# Patient Record
Sex: Female | Born: 2005 | Race: White | Hispanic: No | Marital: Single | Smoking: Current some day smoker
Health system: Southern US, Community
[De-identification: ages and names within clinical notes are randomized; demographics above are authoritative.]

## PROBLEM LIST (undated history)

## (undated) DIAGNOSIS — F32A Depression, unspecified: Secondary | ICD-10-CM

## (undated) DIAGNOSIS — G43109 Migraine with aura, not intractable, without status migrainosus: Secondary | ICD-10-CM

## (undated) DIAGNOSIS — J45909 Unspecified asthma, uncomplicated: Secondary | ICD-10-CM

## (undated) DIAGNOSIS — F419 Anxiety disorder, unspecified: Secondary | ICD-10-CM

## (undated) HISTORY — DX: Anxiety disorder, unspecified: F41.9

## (undated) HISTORY — DX: Unspecified asthma, uncomplicated: J45.909

## (undated) HISTORY — DX: Migraine with aura, not intractable, without status migrainosus: G43.109

## (undated) HISTORY — PX: APPENDECTOMY: SHX54

## (undated) HISTORY — DX: Depression, unspecified: F32.A

---
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
## 2023-03-04 ENCOUNTER — Ambulatory Visit: Payer: Medicaid Other | Admitting: Family Medicine

## 2023-03-04 ENCOUNTER — Encounter: Payer: Self-pay | Admitting: Family Medicine

## 2023-03-04 ENCOUNTER — Other Ambulatory Visit (HOSPITAL_COMMUNITY)
Admission: RE | Admit: 2023-03-04 | Discharge: 2023-03-04 | Disposition: A | Discharge status: Home / Self Care | Payer: Medicaid Other | Source: Ambulatory Visit | Attending: Family Medicine | Admitting: Family Medicine

## 2023-03-04 VITALS — BP 112/70 | HR 93 | Resp 16 | Ht 67.0 in | Wt 231.0 lb

## 2023-03-04 DIAGNOSIS — Z3041 Encounter for surveillance of contraceptive pills: Secondary | ICD-10-CM | POA: Diagnosis not present

## 2023-03-04 DIAGNOSIS — F332 Major depressive disorder, recurrent severe without psychotic features: Secondary | ICD-10-CM | POA: Diagnosis not present

## 2023-03-04 DIAGNOSIS — G43109 Migraine with aura, not intractable, without status migrainosus: Secondary | ICD-10-CM

## 2023-03-04 DIAGNOSIS — F419 Anxiety disorder, unspecified: Secondary | ICD-10-CM | POA: Diagnosis not present

## 2023-03-04 DIAGNOSIS — Z7251 High risk heterosexual behavior: Secondary | ICD-10-CM

## 2023-03-04 DIAGNOSIS — N96 Recurrent pregnancy loss: Secondary | ICD-10-CM | POA: Diagnosis not present

## 2023-03-04 DIAGNOSIS — Z7689 Persons encountering health services in other specified circumstances: Secondary | ICD-10-CM

## 2023-03-04 DIAGNOSIS — L732 Hidradenitis suppurativa: Secondary | ICD-10-CM

## 2023-03-04 DIAGNOSIS — Z13220 Encounter for screening for lipoid disorders: Secondary | ICD-10-CM

## 2023-03-04 LAB — POCT URINE PREGNANCY: Preg Test, Ur: NEGATIVE

## 2023-03-04 NOTE — Progress Notes (Unsigned)
New patient visit   Patient: Connie Carter   DOB: 11-Feb-2005   18 y.o. Female  MRN: [id number] Visit Date: 03/04/2023  Today's healthcare provider: Sherlyn Hay, DO   Chief Complaint  Patient presents with   Establish Care   Migraine    Chronic issue. Takes Maxalt-need a refill   Contraception    Need a refill   Subjective    Connie Carter is a 18 y.o. female who presents today as a new patient to establish care.  Migraine    HPI     Migraine    Additional comments: Chronic issue. Takes Maxalt-need a refill        Contraception    Additional comments: Need a refill      Last edited by Marjie Skiff, CMA on 03/04/2023  1:51 PM.        ***  Past Medical History:  Diagnosis Date   Asthma    Past Surgical History:  Procedure Laterality Date   APPENDECTOMY     No family status information on file.   No family history on file. Social History   Socioeconomic History   Marital status: Single    Spouse name: Not on file   Number of children: Not on file   Years of education: Not on file   Highest education level: Not on file  Occupational History   Not on file  Tobacco Use   Smoking status: Some Days    Types: Cigarettes, E-cigarettes   Smokeless tobacco: Never  Vaping Use   Vaping status: Every Day  Substance and Sexual Activity   Alcohol use: Not Currently    Alcohol/week: 2.0 - 4.0 standard drinks of alcohol    Types: 2 - 3 Standard drinks or equivalent per week   Drug use: Yes    Types: Marijuana   Sexual activity: Not on file  Other Topics Concern   Not on file  Social History Narrative   Not on file   Social Drivers of Health   Financial Resource Strain: Not on file  Food Insecurity: Not on file  Transportation Needs: Not on file  Physical Activity: Not on file  Stress: Not on file  Social Connections: Not on file   Outpatient Medications Prior to Visit  Medication Sig   acetaminophen (TYLENOL) 325 MG tablet Take 650  mg by mouth every 6 (six) hours as needed for headache.   ibuprofen (ADVIL,MOTRIN) 600 MG tablet Take 600 mg by mouth every 6 (six) hours. (Patient not taking: Reported on 03/09/2021)   norethindrone-ethinyl estradiol (NORTREL 7/7/7) 0.5/0.75/1-35 MG-MCG tablet Take by mouth.   ondansetron (ZOFRAN-ODT) 4 MG disintegrating tablet Take by mouth.   rizatriptan (MAXALT-MLT) 10 MG disintegrating tablet Take by mouth.   sulfamethoxazole-trimethoprim (BACTRIM DS) 800-160 MG tablet Take 1 tablet by mouth 2 (two) times daily.   No facility-administered medications prior to visit.   Allergies  Allergen Reactions   Kiwi Extract Swelling    Oral swelling, itching    Immunization History  Administered Date(s) Administered   DTaP 05/11/2003, 06/14/2003, 07/12/2003, 09/14/2003, 06/12/2004, 04/09/2008   HIB (PRP-OMP) 05/11/2003, 07/02/2003, 09/14/2003, 06/12/2004   HIB, Unspecified 03/11/2006   HPV Quadrivalent 07/02/2014, 08/17/2015   Hepatitis A, Ped/Adol-2 Dose 03/12/2005, 03/11/2006   Hepatitis B, PED/ADOLESCENT December 30, 2003, 04/02/2003, 12/13/2003   Influenza-Unspecified 11/17/2007, 11/18/2010, 01/01/2012, 12/04/2012, 10/26/2013, 01/19/2015, 11/20/2016, 11/27/2017   MMR 03/13/2004, 04/09/2008   PFIZER(Purple Top)SARS-COV-2 Vaccination 07/27/2019, 08/17/2019   PPD Test 07/10/2022   Tdap  12/09/2019    Health Maintenance  Topic Date Due   Pneumococcal Vaccine 72-19 Years old (1 of 2 - PCV) Never done   HIV Screening  Never done   Hepatitis C Screening  Never done   COVID-19 Vaccine (3 - 2024-25 season) 09/16/2022   INFLUENZA VACCINE  04/15/2023 (Originally 08/16/2022)   DTaP/Tdap/Td (7 - Td or Tdap) 12/08/2029   HPV VACCINES  Completed    Patient Care Team: Sherlyn Hay, DO as PCP - General (Family Medicine)   {Insert previous labs (optional):23779} {See past labs  Heme  Chem  Endocrine  Serology  Results Review (optional):1}   Objective    BP 112/70 (BP Location: Left Arm,  Patient Position: Sitting, Cuff Size: Large)   Pulse 93   Resp 16   Ht 5\' 7"  (1.702 m)   Wt 231 lb (104.8 kg)   LMP 01/29/2023 Comment: 01/27-01/28 had a short period  SpO2 99%   BMI 36.18 kg/m  {Insert last BP/Wt (optional):23777}{See vitals history (optional):1}   Physical Exam Vitals and nursing note reviewed.  Constitutional:      General: She is not in acute distress.    Appearance: Normal appearance.  HENT:     Head: Normocephalic and atraumatic.  Eyes:     General: No scleral icterus.    Conjunctiva/sclera: Conjunctivae normal.  Cardiovascular:     Rate and Rhythm: Normal rate.  Pulmonary:     Effort: Pulmonary effort is normal.  Neurological:     Mental Status: She is alert and oriented to person, place, and time. Mental status is at baseline.  Psychiatric:        Mood and Affect: Mood normal.        Behavior: Behavior normal.      Depression Screen    03/04/2023    1:49 PM 03/09/2021    1:39 PM  PHQ 2/9 Scores  PHQ - 2 Score 5 0  PHQ- 9 Score 20    No results found for any visits on 03/04/23.  Assessment & Plan     There are no diagnoses linked to this encounter.   ***  No follow-ups on file.     I discussed the assessment and treatment plan with the patient  The patient was provided an opportunity to ask questions and all were answered. The patient agreed with the plan and demonstrated an understanding of the instructions.   The patient was advised to call back or seek an in-person evaluation if the symptoms worsen or if the condition fails to improve as anticipated.    Sherlyn Hay, DO  [facility] [phone] (phone) [phone] (fax)  [facility]

## 2023-03-04 NOTE — Patient Instructions (Signed)
Sudafed - take one tablet to begin with, then, if tolerated well, can increase to two as per instructions on back.

## 2023-03-05 ENCOUNTER — Encounter: Payer: Self-pay | Admitting: Family Medicine

## 2023-03-05 DIAGNOSIS — Z3041 Encounter for surveillance of contraceptive pills: Secondary | ICD-10-CM | POA: Insufficient documentation

## 2023-03-05 DIAGNOSIS — G43109 Migraine with aura, not intractable, without status migrainosus: Secondary | ICD-10-CM | POA: Insufficient documentation

## 2023-03-05 DIAGNOSIS — F332 Major depressive disorder, recurrent severe without psychotic features: Secondary | ICD-10-CM | POA: Insufficient documentation

## 2023-03-05 DIAGNOSIS — L732 Hidradenitis suppurativa: Secondary | ICD-10-CM | POA: Insufficient documentation

## 2023-03-05 DIAGNOSIS — Z7689 Persons encountering health services in other specified circumstances: Secondary | ICD-10-CM | POA: Insufficient documentation

## 2023-03-05 DIAGNOSIS — N96 Recurrent pregnancy loss: Secondary | ICD-10-CM | POA: Insufficient documentation

## 2023-03-05 DIAGNOSIS — Z7251 High risk heterosexual behavior: Secondary | ICD-10-CM | POA: Insufficient documentation

## 2023-03-05 LAB — CERVICOVAGINAL ANCILLARY ONLY
Chlamydia: NEGATIVE
Comment: NEGATIVE
Comment: NEGATIVE
Comment: NORMAL
Neisseria Gonorrhea: NEGATIVE
Trichomonas: NEGATIVE

## 2023-03-05 MED ORDER — RIZATRIPTAN BENZOATE 10 MG PO TBDP: 10.0000 mg | ORAL_TABLET | ORAL | 0 refills | Status: DC | PRN

## 2023-03-05 MED ORDER — NORELGESTROMIN-ETH ESTRADIOL 150-35 MCG/24HR TD PTWK: 1.0000 | MEDICATED_PATCH | TRANSDERMAL | 12 refills | Status: DC

## 2023-03-05 NOTE — Assessment & Plan Note (Signed)
Reports recurrent cysts in axillary, inguinal, and gluteal regions. Symptoms are consistent with hidradenitis suppurativa. Discussed monitoring symptoms and considering dermatology referral if condition worsens. - Monitor symptoms and consider dermatology referral if condition worsens.

## 2023-03-05 NOTE — Assessment & Plan Note (Signed)
Experiences migraines approximately once a week, lasting from a few hours to requiring extended sleep. Uses rizatriptan (Maxalt) as needed but reports infrequent use. Discussed the importance of early intervention with medication to prevent prolonged episodes. - Refill rizatriptan (Maxalt).

## 2023-03-05 NOTE — Assessment & Plan Note (Signed)
Reports struggling with mental health and requests a referral to a therapist or psychiatrist. Prefers to start with therapy due to trauma history and a desire to avoid medication initially. Discussed cognitive behavioral therapy as first-line treatment for many mental health conditions. - Refer to psychology for cognitive behavioral therapy.

## 2023-03-05 NOTE — Assessment & Plan Note (Signed)
Establishing care and has not had a Pap smear yet. Reports smoking and vaping, occasional marijuana use, and requests STD testing. Discussed the importance of weight-bearing exercises and supplements for bone health due to family history of osteoporosis. Advised on smoking cessation and reducing/stopping vaping. Discussed eligibility for pneumonia vaccine and annual COVID booster. - Order STD testing including HIV and hepatitis C. - Recommend weight-bearing exercises and continue calcium and collagen supplements. - Advise on smoking cessation and reducing/stopping vaping. - Discuss eligibility for pneumonia vaccine and annual COVID booster.

## 2023-03-05 NOTE — Assessment & Plan Note (Addendum)
Currently on birth control pills but reports irregular use and two miscarriages since 2023. Concerned about future fertility and mentions a family history of reproductive issues. Prefers to continue with the pill but is open to trying a patch for better consistency. Discussed the importance of consistent use of the patch and backup contraception if not used consistently. Explained that 85% of people practicing unprotected sex will conceive within a year. Discussed options including Nexplanon and IUD, with emphasis on potential side effects and the need for consistent use. - Prescribe birth control patch. - Perform urine pregnancy test before prescribing birth control. - negative - Discuss the importance of consistent use of the patch and backup contraception if not used consistently.

## 2023-03-27 LAB — ANTIPHOSPHOLIPID SYNDROME COMP
APTT: 27.2 s
Anticardiolipin Ab, IgA: <10 [APL'U]
Anticardiolipin Ab, IgG: <10 [GPL'U]
Anticardiolipin Ab, IgM: <10 [MPL'U]
Antiphosphatidylserine IgG: 0 {GPS'U}
Antiphosphatidylserine IgM: 0 {MPS'U}
Antiprothrombin Antibody, IgG: 0 G units
Beta-2 Glycoprotein I, IgA: <10 SAU
Beta-2 Glycoprotein I, IgG: <10 SGU
Beta-2 Glycoprotein I, IgM: <10 SMU
DRVVT Screen Seconds: 42.6 s
Hexagonal Phospholipid Neutral: 5 s
Platelet Neutralization: 3.8 s — ABNORMAL HIGH

## 2023-03-27 LAB — HCV AB W REFLEX TO QUANT PCR: HCV Ab: NONREACTIVE

## 2023-03-27 LAB — LIPID PANEL
Chol/HDL Ratio: 2.9 ratio (ref 0.0–4.4)
Cholesterol, Total: 129 mg/dL (ref 100–199)
HDL: 44 mg/dL (ref >39)
LDL Chol Calc (NIH): 71 mg/dL (ref 0–99)
Triglycerides: 68 mg/dL (ref 0–149)
VLDL Cholesterol Cal: 14 mg/dL (ref 5–40)

## 2023-03-27 LAB — HIV ANTIBODY (ROUTINE TESTING W REFLEX): HIV Screen 4th Generation wRfx: NONREACTIVE

## 2023-03-27 LAB — HCV INTERPRETATION

## 2023-04-01 ENCOUNTER — Encounter: Payer: Self-pay | Admitting: Family Medicine

## 2023-05-01 ENCOUNTER — Ambulatory Visit: Payer: Self-pay | Admitting: *Deleted

## 2023-05-01 NOTE — Telephone Encounter (Signed)
 Chief Complaint: anxiety , panic attacks multiple times per day. Affecting work. Requesting work note and to be evaluated Symptoms: moderate anxiety now. Episodes of chest heaviness, sweating, shaking, like "not here" becomes immobile at times. Breathing fast. Some episodes can last 1 hour others few minutes. Affecting work at [employer]. [employer] and drinking well . Has recently lost weight , monitoring meals. Stopped smoking couple of months ago . Frequency: 1 week and 1/2 Pertinent Negatives: Patient denies chest pain no difficulty breathing no thoughts of hurting self or others. Disposition: [] ED /[] Urgent Care (no appt availability in office) / [x] Appointment(In office/virtual)/ []  [facility]/ [] Home Care/ [] Refused Recommended Disposition /[] [facility]/ []  Follow-up with PCP Additional Notes:   No available appt with PCP. Scheduled appt with other provider for tomorrow. Recommended to contact Urgent crisis center if needed. If sx worsen go to ED.     Copied from CRM [id number]. Topic: Clinical - Red Word Triage >> May 01, 2023  3:31 PM Abundio Miu S wrote: [facility] that prompted transfer to Nurse Triage: Anxiety Reason for Disposition  MODERATE anxiety (e.g., persistent or frequent anxiety symptoms; interferes with sleep, school, or work)  Answer Assessment - Initial Assessment Questions 1. CONCERN: "Did anything happen that prompted you to call today?"      Panic attacks x 1 and 1/2 weeks ago  2. ANXIETY SYMPTOMS: "Can you describe how you (your loved one; patient) have been feeling?" (e.g., tense, restless, panicky, anxious, keyed up, overwhelmed, sense of impending doom).      Overwhelmed anxious becomes immobile 3. ONSET: "How long have you been feeling this way?" (e.g., hours, days, weeks)     1 week and 1/2  4. SEVERITY: "How would you rate the level of anxiety?" (e.g., 0 - 10; or mild, moderate, severe).     Moderate now  5. FUNCTIONAL IMPAIRMENT: "How  have these feelings affected your ability to do daily activities?" "Have you had more difficulty than usual doing your normal daily activities?" (e.g., getting better, same, worse; self-care, school, work, interactions)     Affecting work at [employer] 6. HISTORY: "Have you felt this way before?" "Have you ever been diagnosed with an anxiety problem in the past?" (e.g., generalized anxiety disorder, panic attacks, PTSD). If Yes, ask: "How was this problem treated?" (e.g., medicines, counseling, etc.)     No , recently stopped smoking greater than 1 month/approx couple of months ago  7. RISK OF HARM - SUICIDAL IDEATION: "Do you ever have thoughts of hurting or killing yourself?" If Yes, ask:  "Do you have these feelings now?" "Do you have a plan on how you would do this?"     No  8. TREATMENT:  "What has been done so far to treat this anxiety?" (e.g., medicines, relaxation strategies). "What has helped?"     Relaxation allow episode to pass 9. TREATMENT - THERAPIST: "Do you have a counselor or therapist? Name?"     na 10. POTENTIAL TRIGGERS: "Do you drink caffeinated beverages (e.g., coffee, colas, teas), and how much daily?" "Do you drink alcohol or use any drugs?" "Have you started any new medicines recently?"       Na  11. PATIENT SUPPORT: "Who is with you now?" "Who do you live with?" "Do you have family or friends who you can talk to?"        Yes friends 12. OTHER SYMPTOMS: "Do you have any other symptoms?" (e.g., feeling depressed, trouble concentrating, trouble sleeping, trouble  breathing, palpitations or fast heartbeat, chest pain, sweating, nausea, or diarrhea)       Trouble sleeping works "weird hours" 4 pm - 12 am. Recently stopped smoking past couple of months 13. PREGNANCY: "Is there any chance you are pregnant?" "When was your last menstrual period?"       na  Protocols used: Anxiety and Panic Attack-A-AH

## 2023-05-02 ENCOUNTER — Encounter: Payer: Self-pay | Admitting: Family Medicine

## 2023-05-02 ENCOUNTER — Encounter: Payer: Self-pay | Admitting: Physician Assistant

## 2023-05-02 ENCOUNTER — Ambulatory Visit (INDEPENDENT_AMBULATORY_CARE_PROVIDER_SITE_OTHER): Admitting: Physician Assistant

## 2023-05-02 VITALS — BP 116/79 | HR 64 | Resp 16 | Wt 230.0 lb

## 2023-05-02 DIAGNOSIS — F332 Major depressive disorder, recurrent severe without psychotic features: Secondary | ICD-10-CM

## 2023-05-02 DIAGNOSIS — G43109 Migraine with aura, not intractable, without status migrainosus: Secondary | ICD-10-CM | POA: Diagnosis not present

## 2023-05-02 DIAGNOSIS — F419 Anxiety disorder, unspecified: Secondary | ICD-10-CM | POA: Diagnosis not present

## 2023-05-02 MED ORDER — PAROXETINE HCL 10 MG PO TABS: 10.0000 mg | ORAL_TABLET | Freq: Every day | ORAL | 1 refills | Status: DC

## 2023-05-02 NOTE — Progress Notes (Signed)
 Established patient visit  Patient: Connie Carter   DOB: 11/23/2005   18 y.o. Female  MRN: [id number] Visit Date: 05/02/2023  Today's healthcare provider: Blane Bunting, PA-C   Chief Complaint  Patient presents with   Anxiety    Anxiety/ Panic Attacks. No other concerns   Subjective      History of Present Illness The patient, a 18 year old female, presents with recurrent anxiety attacks for the past two to three weeks. These attacks occur multiple times a day and last anywhere from fifteen minutes to an hour. The patient describes the attacks as intense, with symptoms including sweating, hot and cold flashes, chest tightness, and body shaking. The most severe symptoms, such as chest tightness and shaking, typically subside after about fifteen minutes, but the residual anxiety and temperature fluctuations can persist for up to an hour.  The patient reports no significant changes in her life that could have triggered these attacks. She works Monday through Friday from 4 PM to 12 AM at [employer], a job she enjoys and finds easy. She lives with her mother and has a cat. She is in a relationship with a boyfriend who she describes as great. She also mentions that fluorescent lights at her workplace seem to exacerbate her anxiety symptoms.  In addition to her anxiety, the patient also suffers from migraines, for which she takes a dissolvable medication as needed. She reports that these migraines often occur after an anxiety attack, particularly when she is at work. She also uses a birth control patch.       05/02/2023    2:16 PM 03/04/2023    1:49 PM 03/09/2021    1:39 PM  Depression screen PHQ 2/9  Decreased Interest 2 2 0  Down, Depressed, Hopeless 0 3 0  PHQ - 2 Score 2 5 0  Altered sleeping 3 3   Tired, decreased energy  3   Change in appetite 3 3   Feeling bad or failure about yourself  0 3   Trouble concentrating 0 2   Moving slowly or fidgety/restless 0 0   Suicidal thoughts 0 1    PHQ-9 Score 8 20   Difficult doing work/chores Somewhat difficult Somewhat difficult       05/02/2023    2:16 PM 03/04/2023    1:50 PM  GAD 7 : Generalized Anxiety Score  Nervous, Anxious, on Edge 3 3  Control/stop worrying 3 2  Worry too much - different things 3 2  Trouble relaxing 3 2  Restless 3 1  Easily annoyed or irritable 3 3  Afraid - awful might happen 3 1  Total GAD 7 Score 21 14  Anxiety Difficulty Extremely difficult Somewhat difficult    Medications: Outpatient Medications Prior to Visit  Medication Sig   acetaminophen  (TYLENOL ) 325 MG tablet Take 650 mg by mouth every 6 (six) hours as needed for headache.   norelgestromin -ethinyl estradiol  (XULANE) 150-35 MCG/24HR transdermal patch Place 1 patch onto the skin once a week.   rizatriptan  (MAXALT -MLT) 10 MG disintegrating tablet Take 1 tablet (10 mg total) by mouth as needed for migraine.   No facility-administered medications prior to visit.    Review of Systems All negative Except see HPI       Objective    BP 116/79 (BP Location: Left Arm, Patient Position: Sitting, Cuff Size: Normal)   Pulse 64   Resp 16   Wt 230 lb (104.3 kg)   SpO2 100%   BMI 36.02 kg/m  Physical Exam Constitutional:      General: She is not in acute distress.    Appearance: Normal appearance.  HENT:     Head: Normocephalic.  Pulmonary:     Effort: Pulmonary effort is normal. No respiratory distress.  Neurological:     Mental Status: She is alert and oriented to person, place, and time. Mental status is at baseline.      No results found for any visits on 05/02/23.      Assessment & Plan Anxiety Disorder Chronic Worsening Frequent anxiety attacks with physical symptoms and residual anxiety. No significant life changes. Unlikely exacerbation from birth control. Discussed medication and therapy options. - Prescribe paroxetine  10 mg. Start with half tablet, increase to full tablet after 2-3 days if tolerated. -  Recommend therapist through [facility] for behavioral therapy. - Monitor for symptom exacerbation related to birth control or migraine medication.  Migraine Occasional migraines, sometimes post-anxiety attacks, especially under fluorescent lights. No recent frequency increase. Unlikely exacerbation from birth control. - Continue current migraine medication as needed. - Monitor correlation between anxiety attacks and migraines, especially at work. Follow-up with pcp  Anxiety (Primary) - PARoxetine  (PAXIL ) 10 MG tablet; Take 1 tablet (10 mg total) by mouth daily.  Dispense: 30 tablet; Refill: 1 - Ambulatory referral to Psychiatry - Ambulatory referral to Psychology  Orders Placed This Encounter  Procedures   Ambulatory referral to Psychiatry    Referral Priority:   Routine    Referral Type:   Psychiatric    Referral Reason:   Specialty Services Required    Requested Specialty:   Psychiatry    Number of Visits Requested:   1   Ambulatory referral to Psychology    Referral Priority:   Routine    Referral Type:   Psychiatric    Referral Reason:   Specialty Services Required    Requested Specialty:   Psychology    Number of Visits Requested:   1    No follow-ups on file.   The patient was advised to call back or seek an in-person evaluation if the symptoms worsen or if the condition fails to improve as anticipated.  I discussed the assessment and treatment plan with the patient. The patient was provided an opportunity to ask questions and all were answered. The patient agreed with the plan and demonstrated an understanding of the instructions.  I, Jasen Hartstein, PA-C have reviewed all documentation for this visit. The documentation on 05/02/2023  for the exam, diagnosis, procedures, and orders are all accurate and complete.  Blane Bunting, [facility], MMS [facility] [phone] (phone) [phone] (fax)  [facility]

## 2023-05-31 ENCOUNTER — Encounter: Payer: Medicaid Other | Admitting: Family Medicine

## 2023-06-25 ENCOUNTER — Ambulatory Visit (INDEPENDENT_AMBULATORY_CARE_PROVIDER_SITE_OTHER): Admitting: Family Medicine

## 2023-06-25 ENCOUNTER — Encounter: Payer: Self-pay | Admitting: Family Medicine

## 2023-06-25 VITALS — BP 110/81 | HR 68 | Wt 235.6 lb

## 2023-06-25 DIAGNOSIS — Z3009 Encounter for other general counseling and advice on contraception: Secondary | ICD-10-CM | POA: Diagnosis not present

## 2023-06-25 DIAGNOSIS — F332 Major depressive disorder, recurrent severe without psychotic features: Secondary | ICD-10-CM

## 2023-06-25 DIAGNOSIS — Z713 Dietary counseling and surveillance: Secondary | ICD-10-CM

## 2023-06-25 DIAGNOSIS — E66812 Obesity, class 2: Secondary | ICD-10-CM | POA: Diagnosis not present

## 2023-06-25 DIAGNOSIS — Z79899 Other long term (current) drug therapy: Secondary | ICD-10-CM | POA: Diagnosis not present

## 2023-06-25 DIAGNOSIS — F419 Anxiety disorder, unspecified: Secondary | ICD-10-CM

## 2023-06-25 MED ORDER — PAROXETINE HCL 20 MG PO TABS: 20.0000 mg | ORAL_TABLET | Freq: Every day | ORAL | 3 refills | Status: DC

## 2023-06-25 MED ORDER — TOPIRAMATE 25 MG PO TABS: ORAL_TABLET | ORAL | 2 refills | Status: AC

## 2023-06-25 MED ORDER — PHENTERMINE HCL 37.5 MG PO CAPS: 37.5000 mg | ORAL_CAPSULE | ORAL | 0 refills | Status: AC

## 2023-06-25 MED ORDER — PHENTERMINE HCL 15 MG PO CAPS: 15.0000 mg | ORAL_CAPSULE | ORAL | 0 refills | Status: DC

## 2023-06-25 NOTE — Progress Notes (Addendum)
 Established patient visit   Patient: Connie Carter   DOB: 2005/11/19   18 y.o. Female  MRN: [id number] Visit Date: 06/25/2023  Today's healthcare provider: Carlean Charter, DO   Chief Complaint  Patient presents with   Follow-up    Weight loss, discuss paxil  medication. Wants an IUD placed possible referral or schedule to have one place   Subjective    HPI Connie Carter is a 18 year old female who presents for contraceptive management and weight loss consultation.  She is currently using contraceptive patches but finds them irritating to her skin and ineffective, as she experienced an unintended pregnancy and subsequently had an abortion. She is interested in switching to an IUD for more reliable contraception.  She is taking paroxetine , which is effective for her mental health, but the effects wear off by 10 PM, impacting her ability to work late shifts. She is considering adjusting the dosage to maintain its efficacy throughout her work hours.  She is actively trying to lose weight and has been working out extensively and eating well, but is not seeing the desired results. She reports gaining weight, possibly muscle, but not losing fat. She has consulted with two nutritionists in the past without success and is interested in exploring medication options to aid in weight loss. No history of pancreatitis, medullary thyroid cancer, or multiple endocrine neoplasia type two. She prefers oral options for weight management over injectable medications.  She smokes tobacco occasionally and is attempting to wean herself off by using lower nicotine density products. No thoughts of self-harm or harm to others.      Medications: Outpatient Medications Prior to Visit  Medication Sig   acetaminophen  (TYLENOL ) 325 MG tablet Take 650 mg by mouth every 6 (six) hours as needed for headache.   rizatriptan  (MAXALT -MLT) 10 MG disintegrating tablet Take 1 tablet (10 mg total) by mouth as needed  for migraine.   [DISCONTINUED] PARoxetine  (PAXIL ) 10 MG tablet Take 1 tablet (10 mg total) by mouth daily.   norelgestromin -ethinyl estradiol  (XULANE) 150-35 MCG/24HR transdermal patch Place 1 patch onto the skin once a week. (Patient not taking: Reported on 06/25/2023)   No facility-administered medications prior to visit.        Objective    BP 110/81 (BP Location: Left Arm, Patient Position: Sitting, Cuff Size: Large)   Pulse 68   Wt 235 lb 9.6 oz (106.9 kg)   LMP 06/13/2023   SpO2 99%   Breastfeeding No   BMI 36.90 kg/m     Physical Exam Vitals and nursing note reviewed.  Constitutional:      General: She is not in acute distress.    Appearance: Normal appearance.  HENT:     Head: Normocephalic and atraumatic.  Eyes:     General: No scleral icterus.    Conjunctiva/sclera: Conjunctivae normal.  Cardiovascular:     Rate and Rhythm: Normal rate.  Pulmonary:     Effort: Pulmonary effort is normal.  Neurological:     Mental Status: She is alert and oriented to person, place, and time. Mental status is at baseline.  Psychiatric:        Mood and Affect: Mood normal.        Behavior: Behavior normal.      No results found for any visits on 06/25/23.  Assessment & Plan    Weight loss counseling, encounter for -     EKG 12-Lead  Obesity, Class II, BMI 35-39.9 -  EKG 12-Lead  Encounter for counseling regarding contraception  Recurrent severe major depressive disorder with anxiety (HCC)  High risk medication use -     EKG 12-Lead     Contraceptive counseling Contraceptive patches cause skin irritation and are ineffective. Experienced unintended pregnancy. Interested in IUD for reliable contraception. Strongly recommended IUD for efficacy and convenience. - Refer to gynecology for IUD placement. - Advise to continue using the contraceptive patch until the IUD is placed.  Obesity (BMI 35.0-39.9); weight loss counseling Struggling with weight loss despite  exercise and diet. Interested in pharmacological assistance. Discussed options and recommended phentermine and topiramate due to lifestyle and potential benefits. Phentermine limited to three months due to side effects. Topiramate may cause fatigue. - Perform EKG to assess cardiac health before starting phentermine.- Showed sinus arrhythmia with no ectopy or ST changes, rate 71, PR interval 138, QRS 88, QT/QTc 406/441. - Prescribe phentermine and topiramate with a tapering dose schedule. - Advise to follow up in 6 weeks to assess progress. - Recommend at least 60 minutes of moderate-intensity exercise most days of the week.  Depression Paroxetine  effective but wears off by evening. Agreed to increase dose to 20 mg for consistent symptom control. - Increase paroxetine  dose to 20 mg. - Provide a 90-day prescription with refills.  Nicotine dependence (intermittent vaping); Smoking Cessation counseling Reducing nicotine intake. Encouraged to continue efforts to quit smoking. Discussed potential future use of bupropion for cessation. - Encourage continued reduction of nicotine intake. - Discuss potential use of bupropion for smoking cessation in the future.    Return in about 6 weeks (around 08/06/2023).      I discussed the assessment and treatment plan with the patient  The patient was provided an opportunity to ask questions and all were answered. The patient agreed with the plan and demonstrated an understanding of the instructions.   The patient was advised to call back or seek an in-person evaluation if the symptoms worsen or if the condition fails to improve as anticipated.    Carlean Charter, DO  [facility] [phone] (phone) [phone] (fax)  [facility]

## 2023-06-30 ENCOUNTER — Other Ambulatory Visit: Payer: Self-pay | Admitting: Physician Assistant

## 2023-06-30 DIAGNOSIS — F419 Anxiety disorder, unspecified: Secondary | ICD-10-CM

## 2023-07-08 ENCOUNTER — Other Ambulatory Visit: Payer: Self-pay | Admitting: Family Medicine

## 2023-07-08 ENCOUNTER — Telehealth: Payer: Self-pay

## 2023-07-08 DIAGNOSIS — Z713 Dietary counseling and surveillance: Secondary | ICD-10-CM

## 2023-07-08 DIAGNOSIS — E66812 Obesity, class 2: Secondary | ICD-10-CM

## 2023-07-08 NOTE — Telephone Encounter (Unsigned)
 Copied from CRM [id number]. Topic: Clinical - Medication Question >> Jul 08, 2023 11:35 AM [place] B wrote: Reason for CRM: pt asked if she should be taking the higheer dose of phentermine  37.5 MG capsule, it shows 2 different does on her chart

## 2023-07-24 ENCOUNTER — Other Ambulatory Visit: Payer: Self-pay | Admitting: Family Medicine

## 2023-07-24 ENCOUNTER — Ambulatory Visit: Admitting: Family Medicine

## 2023-07-24 DIAGNOSIS — G43109 Migraine with aura, not intractable, without status migrainosus: Secondary | ICD-10-CM

## 2023-07-31 NOTE — Progress Notes (Unsigned)
 Donzella Lauraine SAILOR, DO   No chief complaint on file.   HPI:      Ms. Connie Carter is a 18 y.o. G1P0 whose LMP was No LMP recorded., presents today for NP consult BC Neg STD testing 2/25   Patient Active Problem List   Diagnosis Date Noted   Recurrent severe major depressive disorder with anxiety (HCC) 03/05/2023   Establishing care with new doctor, encounter for 03/05/2023   High risk heterosexual behavior 03/05/2023   History of multiple miscarriages 03/05/2023   Migraine with aura and without status migrainosus, not intractable 03/05/2023   Encounter for surveillance of contraceptive pills 03/05/2023   Hidradenitis suppurativa of multiple sites 03/05/2023    Past Surgical History:  Procedure Laterality Date   APPENDECTOMY      No family history on file.  Social History   Socioeconomic History   Marital status: Single    Spouse name: Not on file   Number of children: Not on file   Years of education: Not on file   Highest education level: Not on file  Occupational History   Not on file  Tobacco Use   Smoking status: Some Days    Types: Cigarettes, E-cigarettes   Smokeless tobacco: Never  Vaping Use   Vaping status: Every Day  Substance and Sexual Activity   Alcohol use: Not Currently    Alcohol/week: 2.0 - 4.0 standard drinks of alcohol    Types: 2 - 3 Standard drinks or equivalent per week   Drug use: Yes    Types: Marijuana   Sexual activity: Yes    Birth control/protection: OCP  Other Topics Concern   Not on file  Social History Narrative   Not on file   Social Drivers of Health   Financial Resource Strain: Low Risk  (06/25/2023)   Overall Financial Resource Strain (CARDIA)    Difficulty of Paying Living Expenses: Not hard at all  Food Insecurity: No Food Insecurity (06/25/2023)   Hunger Vital Sign    Worried About Running Out of Food in the Last Year: Never true    Ran Out of Food in the Last Year: Never true  Transportation Needs: No  Transportation Needs (06/25/2023)   PRAPARE - Administrator, Civil Service (Medical): No    Lack of Transportation (Non-Medical): No  Physical Activity: Sufficiently Active (06/25/2023)   Exercise Vital Sign    Days of Exercise per Week: 5 days    Minutes of Exercise per Session: 120 min  Stress: No Stress Concern Present (06/25/2023)   [employer] of Occupational Health - Occupational Stress Questionnaire    Feeling of Stress : Not at all  Social Connections: Not on file  Intimate Partner Violence: Not At Risk (06/25/2023)   Humiliation, Afraid, Rape, and Kick questionnaire    Fear of Current or Ex-Partner: No    Emotionally Abused: No    Physically Abused: No    Sexually Abused: No    Outpatient Medications Prior to Visit  Medication Sig Dispense Refill   acetaminophen  (TYLENOL ) 325 MG tablet Take 650 mg by mouth every 6 (six) hours as needed for headache.     norelgestromin -ethinyl estradiol  (XULANE) 150-35 MCG/24HR transdermal patch Place 1 patch onto the skin once a week. (Patient not taking: Reported on 06/25/2023) 3 patch 12   PARoxetine  (PAXIL ) 20 MG tablet Take 1 tablet (20 mg total) by mouth daily. 90 tablet 3   phentermine  15 MG capsule Take 1 capsule (  15 mg total) by mouth every morning. 14 capsule 0   phentermine  37.5 MG capsule Take 1 capsule (37.5 mg total) by mouth every morning. 30 capsule 0   rizatriptan  (MAXALT -MLT) 10 MG disintegrating tablet TAKE 1 TABLET BY MOUTH AS NEEDED FOR MIGRAINE 10 tablet 2   topiramate  (TOPAMAX ) 25 MG tablet Week 1: Take 1 tablet daily.  Week 2: Take 2 tablets daily.  Week 3: Take 3 tablets daily.  Week 4: Take 4 tablets daily.  If unable to tolerate higher dose, stay at maximum tolerated dose. 120 tablet 2   No facility-administered medications prior to visit.      ROS:  Review of Systems BREAST: No symptoms   OBJECTIVE:   Vitals:  There were no vitals taken for this visit.  Physical Exam  Results: No  results found for this or any previous visit (from the past 24 hours).   Assessment/Plan: No diagnosis found.    No orders of the defined types were placed in this encounter.     No follow-ups on file.  Ameliana Brashear B. Tamala Manzer, PA-C 07/31/2023 1:39 PM

## 2023-08-01 ENCOUNTER — Encounter: Payer: Self-pay | Admitting: Obstetrics and Gynecology

## 2023-08-01 ENCOUNTER — Ambulatory Visit: Admitting: Obstetrics and Gynecology

## 2023-08-01 VITALS — BP 109/73 | HR 79 | Ht 67.5 in | Wt 223.0 lb

## 2023-08-01 DIAGNOSIS — Z30017 Encounter for initial prescription of implantable subdermal contraceptive: Secondary | ICD-10-CM

## 2023-08-01 DIAGNOSIS — Z3009 Encounter for other general counseling and advice on contraception: Secondary | ICD-10-CM | POA: Diagnosis not present

## 2023-08-01 NOTE — Patient Instructions (Signed)
 I value your feedback and you entrusting [place] with your care. If you get a [place] patient survey, I would appreciate you taking the time to let us know about your experience today. Thank you! ? ? ?

## 2023-08-06 ENCOUNTER — Encounter: Payer: Self-pay | Admitting: Family Medicine

## 2023-08-08 ENCOUNTER — Ambulatory Visit: Admitting: Family Medicine

## 2023-10-22 ENCOUNTER — Emergency Department (HOSPITAL_BASED_OUTPATIENT_CLINIC_OR_DEPARTMENT_OTHER)

## 2023-10-22 ENCOUNTER — Emergency Department (HOSPITAL_BASED_OUTPATIENT_CLINIC_OR_DEPARTMENT_OTHER)
Admission: EM | Admit: 2023-10-22 | Discharge: 2023-10-23 | Disposition: A | Discharge status: Home / Self Care | Attending: Emergency Medicine | Admitting: Emergency Medicine

## 2023-10-22 ENCOUNTER — Other Ambulatory Visit: Payer: Self-pay

## 2023-10-22 DIAGNOSIS — O368910 Maternal care for other specified fetal problems, first trimester, not applicable or unspecified: Secondary | ICD-10-CM | POA: Diagnosis not present

## 2023-10-22 DIAGNOSIS — O208 Other hemorrhage in early pregnancy: Secondary | ICD-10-CM

## 2023-10-22 DIAGNOSIS — Z3A01 Less than 8 weeks gestation of pregnancy: Secondary | ICD-10-CM | POA: Insufficient documentation

## 2023-10-22 DIAGNOSIS — O209 Hemorrhage in early pregnancy, unspecified: Secondary | ICD-10-CM

## 2023-10-22 DIAGNOSIS — O26899 Other specified pregnancy related conditions, unspecified trimester: Secondary | ICD-10-CM

## 2023-10-22 LAB — CBC WITH DIFFERENTIAL/PLATELET
Abs Immature Granulocytes: 0.02 K/uL (ref 0.00–0.07)
Basophils Absolute: 0 K/uL (ref 0.0–0.1)
Basophils Relative: 0 %
Eosinophils Absolute: 0.1 K/uL (ref 0.0–0.5)
Eosinophils Relative: 1 %
HCT: 38.8 % (ref 36.0–46.0)
Hemoglobin: 13.1 g/dL (ref 12.0–15.0)
Immature Granulocytes: 0 %
Lymphocytes Relative: 36 %
Lymphs Abs: 2.7 K/uL (ref 0.7–4.0)
MCH: 30.3 pg (ref 26.0–34.0)
MCHC: 33.8 g/dL (ref 30.0–36.0)
MCV: 89.8 fL (ref 80.0–100.0)
Monocytes Absolute: 0.6 K/uL (ref 0.1–1.0)
Monocytes Relative: 8 %
Neutro Abs: 4.1 K/uL (ref 1.7–7.7)
Neutrophils Relative %: 55 %
Platelets: 247 K/uL (ref 150–400)
RBC: 4.32 MIL/uL (ref 3.87–5.11)
RDW: 12.2 % (ref 11.5–15.5)
WBC: 7.5 K/uL (ref 4.0–10.5)
nRBC: 0 % (ref 0.0–0.2)

## 2023-10-22 LAB — URINALYSIS, ROUTINE W REFLEX MICROSCOPIC
Bilirubin Urine: NEGATIVE
Glucose, UA: NEGATIVE mg/dL
Hgb urine dipstick: NEGATIVE
Ketones, ur: NEGATIVE mg/dL
Leukocytes,Ua: NEGATIVE
Nitrite: NEGATIVE
Protein, ur: NEGATIVE mg/dL
Specific Gravity, Urine: 1.024 (ref 1.005–1.030)
pH: 6.5 (ref 5.0–8.0)

## 2023-10-22 LAB — PREGNANCY, URINE: Preg Test, Ur: POSITIVE — AB

## 2023-10-22 LAB — HCG, QUANTITATIVE, PREGNANCY: hCG, Beta Chain, Quant, S: 15953 m[IU]/mL — ABNORMAL HIGH (ref <5)

## 2023-10-22 NOTE — ED Triage Notes (Signed)
 Pt caox4 stating she thinks she is having a miscarriage. LMP late August, 2 +home preg tests 2 wks ago, states she had vaginal bleeding for 2 days which stopped yesterday and has had abd cramping x1 wk. [id number]

## 2023-10-22 NOTE — ED Provider Notes (Signed)
 [place] EMERGENCY DEPARTMENT AT [facility] Provider Note   CSN: [id number] Arrival date & time: 10/22/23  1734     Patient presents with: No chief complaint on file.   Connie Carter is a 18 y.o. female.  {Add pertinent medical, surgical, social history, OB history to HPI:32947} HPI     Prior to Admission medications   Medication Sig Start Date End Date Taking? Authorizing Provider  acetaminophen  (TYLENOL ) 325 MG tablet Take 650 mg by mouth every 6 (six) hours as needed for headache.    [provider]  phentermine  37.5 MG capsule Take 1 capsule (37.5 mg total) by mouth every morning. 06/25/23   Donzella Lauraine SAILOR, DO  rizatriptan  (MAXALT -MLT) 10 MG disintegrating tablet TAKE 1 TABLET BY MOUTH AS NEEDED FOR MIGRAINE 07/28/23   Pardue, Lauraine SAILOR, DO  topiramate  (TOPAMAX ) 25 MG tablet Week 1: Take 1 tablet daily.  Week 2: Take 2 tablets daily.  Week 3: Take 3 tablets daily.  Week 4: Take 4 tablets daily.  If unable to tolerate higher dose, stay at maximum tolerated dose. 06/25/23   Donzella Lauraine SAILOR, DO    Allergies: Kiwi extract    Review of Systems  Updated Vital Signs BP 122/86 (BP Location: Left Arm)   Pulse 70   Temp 98.6 F (37 C)   Resp 18   Ht 5' 8 (1.727 m)   Wt 99.8 kg   LMP 09/07/2023 (Approximate)   SpO2 100%   BMI 33.45 kg/m   Physical Exam  (all labs ordered are listed, but only abnormal results are displayed) Labs Reviewed  PREGNANCY, URINE - Abnormal; Notable for the following components:      Result Value   Preg Test, Ur POSITIVE (*)    All other components within normal limits  HCG, QUANTITATIVE, PREGNANCY - Abnormal; Notable for the following components:   hCG, Beta Chain, Quant, S 15,953 (*)    All other components within normal limits  CBC WITH DIFFERENTIAL/PLATELET  URINALYSIS, ROUTINE W REFLEX MICROSCOPIC    EKG: None  Radiology: US  OB LESS THAN 14 WEEKS WITH OB TRANSVAGINAL Result Date: 10/22/2023 CLINICAL DATA:  [id number]  Abdominal cramping [id number] Vaginal bleeding [id number] EXAM: OBSTETRIC <14 WK US  AND TRANSVAGINAL OB US  TECHNIQUE: Both transabdominal and transvaginal ultrasound examinations were performed for complete evaluation of the gestation as well as the maternal uterus, adnexal regions, and pelvic cul-de-sac. Transvaginal technique was performed to assess early pregnancy. COMPARISON:  None Available. FINDINGS: Intrauterine gestational sac: Single Yolk sac:  Visualized. Embryo:  Visualized. Cardiac Activity: Visualized. Heart Rate: 130 pm CRL:  2.5 mm   5 w   6 d                  US  EDC: 06/17/2024 Subchorionic hemorrhage:  Small volume. Maternal uterus/adnexae: Corpus luteum cyst within the right ovary. Partially visualized left ovary. IMPRESSION: 1. Single live intrauterine pregnancy with gestational age by ultrasound of 5 weeks and 6 days which is concordant with gestational age by last menstrual period of 6 weeks and 3 days. 2. Small subchorionic hemorrhage. Electronically Signed   By: Morgane  Naveau M.D.   On: 10/22/2023 21:54    {Document cardiac monitor, telemetry assessment procedure when appropriate:32947} Procedures   Medications Ordered in the ED - No data to display    {Click here for ABCD2, HEART and other calculators REFRESH Note before signing:1}  Medical Decision Making Amount and/or Complexity of Data Reviewed Labs: ordered.   ***  {Document critical care time when appropriate  Document review of labs and clinical decision tools ie CHADS2VASC2, etc  Document your independent review of radiology images and any outside records  Document your discussion with family members, caretakers and with consultants  Document social determinants of health affecting pt's care  Document your decision making why or why not admission, treatments were needed:32947:::1}   Final diagnoses:  None    ED Discharge Orders     None

## 2023-10-23 NOTE — ED Notes (Signed)
 Reviewed discharge instructions and follow-up care with pt. Pt verbalized understanding and had no further questions. Pt exited ED without complications.

## 2023-10-23 NOTE — Discharge Instructions (Addendum)
 Your US  revealed an intrauterine pregnancy: IMPRESSION:  1. Single live intrauterine pregnancy with gestational age by  ultrasound of 5 weeks and 6 days which is concordant with  gestational age by last menstrual period of 6 weeks and 3 days.  2. Small subchorionic hemorrhage.    Please follow-up with an OB/GYN for continued prenatal management.
